# Patient Record
Sex: Female | Born: 1950
Health system: Southern US, Community
[De-identification: ages and names within clinical notes are randomized; demographics above are authoritative.]

## PROBLEM LIST (undated history)

## (undated) DIAGNOSIS — E785 Hyperlipidemia, unspecified: Secondary | ICD-10-CM

## (undated) HISTORY — PX: ABDOMINAL HYSTERECTOMY: SHX81

## (undated) HISTORY — PX: BLADDER SURGERY: SHX569

## (undated) HISTORY — DX: Hyperlipidemia, unspecified: E78.5

## (undated) HISTORY — PX: TUBAL LIGATION: SHX77

---

## 2008-09-21 ENCOUNTER — Emergency Department: Payer: Self-pay | Admitting: Unknown Physician Specialty

## 2009-08-13 ENCOUNTER — Ambulatory Visit: Payer: Self-pay | Admitting: Unknown Physician Specialty

## 2009-08-15 ENCOUNTER — Ambulatory Visit: Payer: Self-pay | Admitting: Unknown Physician Specialty

## 2010-07-25 ENCOUNTER — Ambulatory Visit: Payer: Self-pay | Admitting: Internal Medicine

## 2013-09-14 ENCOUNTER — Observation Stay: Payer: Self-pay | Admitting: Internal Medicine

## 2013-09-14 LAB — TROPONIN I
Troponin-I: <0.02 ng/mL
Troponin-I: 0.03 ng/mL

## 2013-09-14 LAB — HEPATIC FUNCTION PANEL A (ARMC)
ALBUMIN: 3.5 g/dL (ref 3.4–5.0)
AST: 21 U/L (ref 15–37)
Alkaline Phosphatase: 64 U/L
Bilirubin, Direct: <0.1 mg/dL (ref 0.00–0.20)
Bilirubin,Total: 0.4 mg/dL (ref 0.2–1.0)
SGPT (ALT): 17 U/L (ref 12–78)
TOTAL PROTEIN: 7.8 g/dL (ref 6.4–8.2)

## 2013-09-14 LAB — CBC
HCT: 39.3 % (ref 35.0–47.0)
HGB: 12.9 g/dL (ref 12.0–16.0)
MCH: 30.8 pg (ref 26.0–34.0)
MCHC: 32.8 g/dL (ref 32.0–36.0)
MCV: 94 fL (ref 80–100)
Platelet: 299 10*3/uL (ref 150–440)
RBC: 4.19 10*6/uL (ref 3.80–5.20)
RDW: 13.3 % (ref 11.5–14.5)
WBC: 6.2 10*3/uL (ref 3.6–11.0)

## 2013-09-14 LAB — BASIC METABOLIC PANEL
Anion Gap: 6 — ABNORMAL LOW (ref 7–16)
BUN: 11 mg/dL (ref 7–18)
CO2: 27 mmol/L (ref 21–32)
Calcium, Total: 8.3 mg/dL — ABNORMAL LOW (ref 8.5–10.1)
Chloride: 104 mmol/L (ref 98–107)
Creatinine: 0.85 mg/dL (ref 0.60–1.30)
EGFR (African American): >60
Glucose: 152 mg/dL — ABNORMAL HIGH (ref 65–99)
OSMOLALITY: 276 (ref 275–301)
POTASSIUM: 3.8 mmol/L (ref 3.5–5.1)
SODIUM: 137 mmol/L (ref 136–145)

## 2013-09-14 LAB — D-DIMER(ARMC): D-DIMER: 659 ng/mL

## 2013-09-14 LAB — PRO B NATRIURETIC PEPTIDE: B-TYPE NATIURETIC PEPTID: 120 pg/mL (ref 0–125)

## 2013-09-14 LAB — HEMOGLOBIN A1C: HEMOGLOBIN A1C: 5.9 % (ref 4.2–6.3)

## 2013-09-14 LAB — MAGNESIUM: Magnesium: 2 mg/dL

## 2013-09-15 DIAGNOSIS — I369 Nonrheumatic tricuspid valve disorder, unspecified: Secondary | ICD-10-CM

## 2013-09-15 DIAGNOSIS — R079 Chest pain, unspecified: Secondary | ICD-10-CM

## 2013-09-15 LAB — BASIC METABOLIC PANEL
Anion Gap: 7 (ref 7–16)
BUN: 9 mg/dL (ref 7–18)
CALCIUM: 8.6 mg/dL (ref 8.5–10.1)
CO2: 28 mmol/L (ref 21–32)
Chloride: 103 mmol/L (ref 98–107)
Creatinine: 0.89 mg/dL (ref 0.60–1.30)
EGFR (Non-African Amer.): >60
Glucose: 153 mg/dL — ABNORMAL HIGH (ref 65–99)
Osmolality: 277 (ref 275–301)
POTASSIUM: 3.6 mmol/L (ref 3.5–5.1)
Sodium: 138 mmol/L (ref 136–145)

## 2013-09-15 LAB — TSH: Thyroid Stimulating Horm: 1.49 u[IU]/mL

## 2013-09-15 LAB — CBC WITH DIFFERENTIAL/PLATELET
Basophil #: 0 10*3/uL (ref 0.0–0.1)
Basophil %: 0.5 %
Eosinophil #: 0.4 10*3/uL (ref 0.0–0.7)
Eosinophil %: 5.9 %
HCT: 39.6 % (ref 35.0–47.0)
HGB: 12.8 g/dL (ref 12.0–16.0)
Lymphocyte #: 2.5 10*3/uL (ref 1.0–3.6)
Lymphocyte %: 40.2 %
MCH: 30.5 pg (ref 26.0–34.0)
MCHC: 32.4 g/dL (ref 32.0–36.0)
MCV: 94 fL (ref 80–100)
Monocyte #: 0.5 x10 3/mm (ref 0.2–0.9)
Monocyte %: 8.4 %
Neutrophil #: 2.8 10*3/uL (ref 1.4–6.5)
Neutrophil %: 45 %
PLATELETS: 283 10*3/uL (ref 150–440)
RBC: 4.21 10*6/uL (ref 3.80–5.20)
RDW: 13.8 % (ref 11.5–14.5)
WBC: 6.3 10*3/uL (ref 3.6–11.0)

## 2013-09-15 LAB — TROPONIN I: TROPONIN-I: 0.05 ng/mL

## 2013-09-15 LAB — LIPID PANEL
CHOLESTEROL: 200 mg/dL (ref 0–200)
HDL Cholesterol: 37 mg/dL — ABNORMAL LOW (ref 40–60)
Ldl Cholesterol, Calc: 129 mg/dL — ABNORMAL HIGH (ref 0–100)
TRIGLYCERIDES: 168 mg/dL (ref 0–200)
VLDL CHOLESTEROL, CALC: 34 mg/dL (ref 5–40)

## 2014-06-24 NOTE — Discharge Summary (Signed)
PATIENT NAMMarvel Plan:  [name], [name] MR#:  [id number] DATE OF BIRTH:  04/03/1950  DATE OF ADMISSION:  09/14/2013 DATE OF DISCHARGE:  09/15/2013  PRIMARY CARE PHYSICIAN:  [name] CorporalFayegh H. Jadali, MD.  CHIEF COMPLAINT: Shortness of breath and weakness.  HISTORY OF PRESENT ILLNESS AND HOSPITAL COURSE:  Ms. 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 She did have some shortness of breath. She was admitted on telemetry and her 3 sets of cardiac enzymes were negative. EKG did not show any acute changes and Myoview stress test, along echo were essentially negative for ischemia. Ejection fraction was 70%. The patient's symptoms improved. She received some IV hydration. Overall, she felt better.  She will be discharged and establish primary care with Dr. Dario [name].   For hyperlipidemia. The patient was recommended to have low fat, low cholesterol diet and follow up with Dr. Dario [name] as outpatient and if her LDL remains persistently elevated, recommend start her on statins. Above was discussed with the patient's daughter. She did voice understanding the discharge planning and overall hospital stay remained stable. The patient remained a full code.   PROCEDURES: Myoview stress test essentially negative.  EF of 70%.   LABORATORY DATA:  CBC within normal limits. Basic metabolic panel within normal limits, except glucose of 153, hemoglobin A1c is 5.9. LDL is 129. Cholesterol is 200. TSH is 1.49. Cardiac enzymes x3 negative.   RADIOLOGY DATA:  CT angiography of the chest is negative for acute PE. Hospital stay otherwise remained stable.   CODE STATUS:  The patient remained a full code.   TIME SPENT: 40 minutes.   ____________________________ [name] HailSona A. Allena KatzPatel, MD sap:ds D: 09/15/2013 14:44:06 ET T: 09/15/2013 15:01:14  ET JOB#: [id number]  cc: [name] Remmers A. Allena KatzPatel, MD, <Dictator> [name] CorporalFayegh H. Jadali, MD Willow OraSONA A Arriyah Madej MD ELECTRONICALLY SIGNED 09/27/2013 15:37

## 2014-06-24 NOTE — H&P (Signed)
PATIENT NAMMarvel [name]:  [name], [name] DATE OF BIRTH:  28-Dec-1950  DATE OF ADMISSION:  09/14/2013  PRIMARY CARE PHYSICIAN: Actually was her GYN doctor, Dr. Barnabas ListerWashington.  REFERRING PHYSICIAN: Sheryl L. Mindi JunkerGottlieb, MD  HISTORY OF PRESENT ILLNESS:  This is a very nice 64 year old female with history of menopause and hyperlipidemia, who comes today with vague symptoms, feeling really tired today. This morning, got up and as the day went on, she started to have symptoms related to shortness of breath. Last night, she was not able to sleep because she felt heaviness in her chest, not able to take deep breaths, not able to lie flat on the bed. She had to sleep with a couple of pillows on her back. This morning, she was doing her normal routine, which is working in garden. She is very active. She does a lot of walking in and out of the house and getting down on her knees a lot.  On one of those times that she was up and down, she got really dizzy, felt very tired and at that point, she decided to come into the Emergency Department.   The patient states that she does not have chest pain or pressure at this moment.  She had some pressure last night, but it was more like difficulty breathing rather than the pain. She does not have any swelling. Denies any palpitations. Her EKG is normal sinus rhythm, but she has multiple PACs on the monitor.   The patient has no nausea, vomiting. A CT scan of the chest was done with contrast showing no signs of PE, but multiple cholesterol plaques into her aorta. The patient is admitted for evaluation of shortness of breath and orthopnea and exertional dyspnea today.   Apparently, the patient was getting short of breath, as she was walking around the house. She was able to do most of her work, but after the time progressed, she was getting more tired and more short of breath with just minimal activity.   REVIEW OF SYSTEMS:  Twelve system review of systems is done.   CONSTITUTIONAL: No fever, fatigue, positive generalized weakness.  EYES: No blurry vision, double vision.  ENT: No difficulty swallowing or tinnitus.  RESPIRATORY: As mentioned above.   CARDIOVASCULAR: Positive orthopnea. No edema. No syncope. No previous arrhythmias. No palpitations.  GASTROINTESTINAL: No nausea, vomiting, constipation, diarrhea.  GENITOURINARY: No dysuria, hematuria.  GYNECOLOGIC: No breast masses.  HEMATOLOGIC AND LYMPHATIC: No anemia, easy bruising or bleeding.  SKIN: No rash or petechiae.  MUSCULOSKELETAL: No neck pain or back pain.  NEUROLOGIC: No numbness, tingling, CVAs or TIAs.  PSYCHIATRIC: No insomnia or depression.   PAST MEDICAL HISTORY: 1.  Hyperlipidemia.  2.  Menopause.   ALLERGIES: Not known drug allergies.   PAST SURGICAL HISTORY:  1.  Total abdominal hysterectomy.  2.  Salpingo-oophorectomy.   CURRENT MEDICATIONS: The patient has an Estradiol patch and that is the only medication she takes.   SOCIAL HISTORY: The patient does not smoke, does not drink, does not use drugs. She lives with her husband, daughter and grandkids.   FAMILY HISTORY: Negative for CVA or MI, as far as she knows. Her family is [place] and does not know much of her history.  She was born in [place], but she has been in the [place] many years.   PHYSICAL EXAMINATION: VITAL SIGNS: Blood pressure 135/95, pulse 79, respirations 16, temperature 98.2, oxygen saturation 97% on room air.  GENERAL:  Alert, oriented x3  in no acute distress. No respiratory distress. Hemodynamically stable.  HEENT: Pupils are equal and reactive. Extraocular movements are intact. Mucosa are moist. Anicteric sclerae. Pink conjunctivae. No oral lesions. No oropharyngeal exudates.  NECK: Supple. No JVD. No thyromegaly. No adenopathy. No carotid bruits. No rigidity.   CARDIOVASCULAR: Mostly regular rate and rhythm with occasional PACs. No displacement of PMI. No tenderness to palpation of anterior  chest wall.  LUNGS: Clear without any wheezing or crepitus. No use of accessory muscles.  ABDOMEN: Soft, nontender, nondistended. No hepatosplenomegaly. No masses. Bowel sounds are positive.  GENITAL: Deferred.  EXTREMITIES: No edema, cyanosis or clubbing. Pulses +2. Capillary refill less than 3.  NEUROLOGIC: Cranial nerves II-XII  intact. Strength is 5/5 all 4  extremities.  PSYCHIATRIC: No agitation. The patient is alert, oriented x3.  SKIN: No rashes or petechiae.   LABORATORY DATA:  Glucose 152, BUN 11, creatinine 0.85, calcium is 8.3. LFTs were within normal limits. Troponin is negative. White blood count 6.2, hemoglobin 12.9, platelet count 299,000. D-dimer is 659.   EKG:  Normal sinus rhythm, but on the monitor you can see multiple PACs.   RADIOLOGY DATA:  Chest x-ray:  Negative for infiltrates, negative for CHF. BNP is normal.   ASSESSMENT AND [name]: A 64 year old female with orthopnea, progressive shortness of breath exertional dyspnea, history of hyperlipidemia and hormonal replacement therapy.  1.  Shortness of breath/dyspnea of exertion and orthopnea. At this moment, the patient had a CT scan of the chest that did not show any pulmonary embolus. The patient has atherosclerotic plaque, but not a known history of coronary artery disease. The patient used to be on a statins, but no longer taking.  2.  Her symptoms ar back and could be related to mild congestive heart failure. The patient has a normal BNP and no signs of fluid overload.  For now, we are just going to monitor, get an echocardiogram in the morning. Also considered possibility of this being related to acute arrhythmias, as the patient has been showing multiple PACs.  Keep the patient on telemetry overnight. Consider the possibility of acute coronary syndrome as an equivalent of angina with her progressive shortness of breath. The patient had atherosclerotic disease. We are going to start her on aspirin, heparin prophylactic.  Start her on a statin and check her lipid profile. Since the  patient has progressive weakness, check TSH.  Check magnesium, as the patient is having multiple PACs. Admit under observation.  At this moment, the patient is hemodynamically stable. Her blood pressure is stable.  No need for a beta blocker, as her heart rate is in the 60s-70s, but we are going to labetalol p.r.n. for the moment.  3.  Morphine if chest pain, but for now, we are going to avoid IV fluids and get an echocardiogram in case the patient has some degree of CHF.  4.  The patient is on hormonal replacement therapy. We are going to hold that for now and monitor closely.  5.  Deep vein thrombosis prophylaxis with heparin, gastrointestinal prophylaxis with Pepcid.   I spent about 50 minutes with this patient.    ____________________________ Felipa Furnace, MD rsg:ds D: 09/14/2013 21:30:59 ET T: 09/14/2013 22:59:03 ET JOB#: [id number]  cc: Felipa Furnace, MD, <Dictator> Chizuko Trine Juanda Chance MD ELECTRONICALLY SIGNED 09/15/2013 23:14

## 2014-08-28 ENCOUNTER — Encounter: Payer: Self-pay | Admitting: Unknown Physician Specialty

## 2014-08-28 ENCOUNTER — Ambulatory Visit (INDEPENDENT_AMBULATORY_CARE_PROVIDER_SITE_OTHER): Payer: Medicare Other | Admitting: Unknown Physician Specialty

## 2014-08-28 VITALS — BP 130/68 | HR 80 | Temp 98.1°F | Ht <= 58 in | Wt 113.2 lb

## 2014-08-28 DIAGNOSIS — E785 Hyperlipidemia, unspecified: Secondary | ICD-10-CM | POA: Insufficient documentation

## 2014-08-28 DIAGNOSIS — Z780 Asymptomatic menopausal state: Secondary | ICD-10-CM

## 2014-08-28 MED ORDER — ESTRADIOL 0.05 MG/24HR TD PTTW: 1.0000 | MEDICATED_PATCH | TRANSDERMAL | Status: DC

## 2014-08-28 NOTE — Progress Notes (Signed)
   BP 130/68 mmHg  Pulse 80  Temp(Src) 98.1 F (36.7 C)  Ht 4\' 10"  (1.473 m)  Wt 113 lb 3.2 oz (51.347 kg)  BMI 23.67 kg/m2  SpO2 98%  LMP  (LMP Unknown)   Subjective:    Patient ID: [name], female    DOB: 07/14/1950, 64 y.o.   MRN: [id number]  HPI: [name] is a 64 y.o. female  Chief Complaint  Patient presents with  . Medication Refill    hormone patch    Relevant past medical, surgical, family and social history reviewed and updated as indicated. Interim medical history since our last visit reviewed. Allergies and medications reviewed and updated.  States that her daughter wants her patch refilled.  It is unclear why she needs this due to language barrier  Review of Systems  Per HPI unless specifically indicated above     Objective:    BP 130/68 mmHg  Pulse 80  Temp(Src) 98.1 F (36.7 C)  Ht 4\' 10"  (1.473 m)  Wt 113 lb 3.2 oz (51.347 kg)  BMI 23.67 kg/m2  SpO2 98%  LMP  (LMP Unknown)  Wt Readings from Last 3 Encounters:  08/28/14 113 lb 3.2 oz (51.347 kg)  04/13/12 112 lb (50.803 kg)    Physical Exam  Assessment & Plan:   Problem List Items Addressed This Visit    None       Follow up plan: No Follow-up on file.   Unable to complete visit due to language barrier.  Unable to maintain connection with the telephone interpreter.  Will reschedule when we can schedule an interpreter ahead of time.

## 2014-08-30 ENCOUNTER — Encounter: Payer: Self-pay | Admitting: Unknown Physician Specialty

## 2014-08-30 ENCOUNTER — Ambulatory Visit (INDEPENDENT_AMBULATORY_CARE_PROVIDER_SITE_OTHER): Payer: Medicare HMO | Admitting: Unknown Physician Specialty

## 2014-08-30 VITALS — BP 132/81 | HR 80 | Temp 97.9°F | Ht <= 58 in | Wt 113.2 lb

## 2014-08-30 DIAGNOSIS — Z1231 Encounter for screening mammogram for malignant neoplasm of breast: Secondary | ICD-10-CM

## 2014-08-30 DIAGNOSIS — Z0000 Encounter for general adult medical examination without abnormal findings: Secondary | ICD-10-CM

## 2014-08-30 DIAGNOSIS — Z79890 Hormone replacement therapy (postmenopausal): Secondary | ICD-10-CM

## 2014-08-30 DIAGNOSIS — Z780 Asymptomatic menopausal state: Secondary | ICD-10-CM

## 2014-08-30 MED ORDER — ESTRADIOL 0.05 MG/24HR TD PTTW: 1.0000 | MEDICATED_PATCH | TRANSDERMAL | Status: DC

## 2014-08-30 NOTE — Progress Notes (Signed)
   BP 132/81 mmHg  Pulse 80  Temp(Src) 97.9 F (36.6 C)  Ht 4\' 10"  (1.473 m)  Wt 113 lb 3.2 oz (51.347 kg)  BMI 23.67 kg/m2  SpO2 100%  LMP  (LMP Unknown)   Subjective:    Patient ID: [name], female    DOB: 03/27/1950, 64 y.o.   MRN: [id number]  HPI: [name] is a 64 y.o. female  Chief Complaint  Patient presents with  . Medication Refill    Relevant past medical, surgical, family and social history reviewed and updated as indicated. Interim medical history since our last visit reviewed. Allergies and medications reviewed and updated.  She would like a refill of her hormone patch.  If she hasn't been on it for 2 weeks but if off it for 2 months she begins to get tired and fatigued.  If she puts the patch back on she is better.  She was advised by Dr. Barnabas ListerWashington to take this all the time to help her with "stamina" and "strength."  Without the hormone she is agitated and angry.    HPI  Review of Systems  Constitutional: Negative.   HENT: Negative.   Eyes: Negative.   Respiratory: Negative.   Cardiovascular: Negative.   Gastrointestinal: Negative.   Endocrine: Negative.   Genitourinary: Negative.   Musculoskeletal: Negative.   Skin: Negative.   Allergic/Immunologic: Negative.   Neurological: Negative.   Hematological: Negative.   Psychiatric/Behavioral: Negative.     Per HPI unless specifically indicated above     Objective:    BP 132/81 mmHg  Pulse 80  Temp(Src) 97.9 F (36.6 C)  Ht 4\' 10"  (1.473 m)  Wt 113 lb 3.2 oz (51.347 kg)  BMI 23.67 kg/m2  SpO2 100%  LMP  (LMP Unknown)  Wt Readings from Last 3 Encounters:  08/30/14 113 lb 3.2 oz (51.347 kg)  08/28/14 113 lb 3.2 oz (51.347 kg)  04/13/12 112 lb (50.803 kg)    Physical Exam  Constitutional: She is oriented to person, place, and time. She appears well-developed and well-nourished. No distress.  HENT:  Head: Normocephalic and atraumatic.  Eyes: Conjunctivae and lids are normal.  Right eye exhibits no discharge. Left eye exhibits no discharge. No scleral icterus.  Cardiovascular: Normal rate and regular rhythm.   Pulmonary/Chest: Effort normal. No respiratory distress.  Abdominal: Normal appearance. There is no splenomegaly or hepatomegaly.  Musculoskeletal: Normal range of motion.  Neurological: She is alert and oriented to person, place, and time.  Skin: Skin is intact. No rash noted. No pallor.  Psychiatric: She has a normal mood and affect. Her behavior is normal. Judgment and thought content normal.   Assessment & Plan:   Problem List Items Addressed This Visit      Other   Menopause - Primary    Other Visit Diagnoses    Hormone replacement therapy        Routine general medical examination at a health care facility            Follow up plan: No Follow-up on file.   Unable to order mammogram.

## 2014-08-30 NOTE — Patient Instructions (Signed)

## 2014-12-28 ENCOUNTER — Other Ambulatory Visit: Payer: Self-pay | Admitting: Unknown Physician Specialty

## 2014-12-29 NOTE — Telephone Encounter (Signed)
I can't find that I have ever seen her You saw her in June and she's on your schedule in December

## 2014-12-29 NOTE — Telephone Encounter (Signed)
Your patient.  Thanks 

## 2014-12-29 NOTE — Telephone Encounter (Signed)
I'm sorry, Elnita MaxwellCheryl, but again, I have never seen her and you just saw her for this in June; I really think she is your patient

## 2015-02-08 ENCOUNTER — Encounter: Payer: Self-pay | Admitting: Unknown Physician Specialty

## 2015-02-14 ENCOUNTER — Other Ambulatory Visit: Payer: Self-pay | Admitting: Unknown Physician Specialty

## 2015-02-14 NOTE — Telephone Encounter (Signed)
Do you mean hormone pateches?  I have not given pain patches

## 2015-02-14 NOTE — Telephone Encounter (Signed)
Called and left patient a voicemail asking for a return call to get clarification on the medication being requested.

## 2015-02-14 NOTE — Addendum Note (Signed)
Addended by: Bayard HuggerPHARR, BRITTANY N on: 02/14/2015 11:47 AM   Modules accepted: Orders

## 2015-02-14 NOTE — Telephone Encounter (Signed)
pts daughter would like to have pain patches sent to [facility] [address]

## 2015-02-14 NOTE — Telephone Encounter (Signed)
Routing to provider  

## 2015-02-15 NOTE — Telephone Encounter (Signed)
Called and left patient's daughter a voicemail asking for a return call to get clarification on the medication being requested.

## 2015-02-16 NOTE — Telephone Encounter (Signed)
Patient's daughter returned my call when I was out of the office yesterday afternoon. So I tried to call her back but she did not answer so I left her a voicemail asking for her to please return my call.

## 2015-02-19 ENCOUNTER — Encounter: Payer: Medicare Other | Admitting: Unknown Physician Specialty

## 2015-02-19 MED ORDER — ESTRADIOL 0.05 MG/24HR TD PTTW: 1.0000 | MEDICATED_PATCH | TRANSDERMAL | Status: DC

## 2015-02-19 NOTE — Telephone Encounter (Signed)
Called and spoke to patient's daughter and she stated they need the estradiol (vivelle-dot) refilled

## 2015-02-19 NOTE — Addendum Note (Signed)
Addended byVonita Moss: Deyon Chizek on: 02/19/2015 06:04 PM   Modules accepted: Orders

## 2015-02-19 NOTE — Telephone Encounter (Signed)
Called and spoke to patient's daughter and she stated they need the estradiol (vivelle-dot) patches refilled and sent to [facility] on [address].

## 2015-03-12 ENCOUNTER — Other Ambulatory Visit: Payer: Self-pay | Admitting: Unknown Physician Specialty

## 2015-04-20 ENCOUNTER — Ambulatory Visit (INDEPENDENT_AMBULATORY_CARE_PROVIDER_SITE_OTHER): Payer: Medicare HMO | Admitting: Unknown Physician Specialty

## 2015-04-20 ENCOUNTER — Encounter: Payer: Self-pay | Admitting: Unknown Physician Specialty

## 2015-04-20 VITALS — BP 133/85 | HR 73 | Temp 98.6°F | Ht <= 58 in | Wt 116.8 lb

## 2015-04-20 DIAGNOSIS — Z0000 Encounter for general adult medical examination without abnormal findings: Secondary | ICD-10-CM | POA: Diagnosis not present

## 2015-04-20 DIAGNOSIS — E785 Hyperlipidemia, unspecified: Secondary | ICD-10-CM | POA: Diagnosis not present

## 2015-04-20 DIAGNOSIS — Z780 Asymptomatic menopausal state: Secondary | ICD-10-CM | POA: Diagnosis not present

## 2015-04-20 DIAGNOSIS — Z23 Encounter for immunization: Secondary | ICD-10-CM

## 2015-04-20 DIAGNOSIS — Z5181 Encounter for therapeutic drug level monitoring: Secondary | ICD-10-CM

## 2015-04-20 MED ORDER — ATORVASTATIN CALCIUM 10 MG PO TABS: 10.0000 mg | ORAL_TABLET | Freq: Every day | ORAL | Status: AC

## 2015-04-20 MED ORDER — ESTRADIOL 0.05 MG/24HR TD PTWK: 0.0500 mg | MEDICATED_PATCH | TRANSDERMAL | Status: AC

## 2015-04-20 NOTE — Patient Instructions (Addendum)
Please do call to schedule your mammogram and bone density; the number to schedule one at either [facility] or [facility] is [phone]    Bone Densitometry Bone densitometry is an imaging test that uses a special X-ray to measure the amount of calcium and other minerals in your bones (bone density). This test is also known as a bone mineral density test or dual-energy X-ray absorptiometry (DXA). The test can measure bone density at your hip and your spine. It is similar to having a regular X-ray. You may have this test to:  Diagnose a condition that causes weak or thin bones (osteoporosis).  Predict your risk of a broken bone (fracture).  Determine how well osteoporosis treatment is working. LET [facility] CARE PROVIDER KNOW ABOUT:  Any allergies you have.  All medicines you are taking, including vitamins, herbs, eye drops, creams, and over-the-counter medicines.  Previous problems you or members of your family have had with the use of anesthetics.  Any blood disorders you have.  Previous surgeries you have had.  Medical conditions you have.  Possibility of pregnancy.  Any other medical test you had within the previous 14 days that used contrast material. RISKS AND COMPLICATIONS Generally, this is a safe procedure. However, problems can occur and may include the following:  This test exposes you to a very small amount of radiation.  The risks of radiation exposure may be greater to unborn children. BEFORE THE PROCEDURE  Do not take any calcium supplements for 24 hours before having the test. You can otherwise eat and drink what you usually do.  Take off all metal jewelry, eyeglasses, dental appliances, and any other metal objects. PROCEDURE  You may lie on an exam table. There will be an X-ray generator below you and an imaging device above you.  Other devices, such as boxes or braces, may be used to position your body properly for the  scan.  You will need to lie still while the machine slowly scans your body.  The images will show up on a computer monitor. AFTER THE PROCEDURE You may need more testing at a later time.   This information is not intended to replace advice given to you by your health care provider. Make sure you discuss any questions you have with your health care provider.   Document Released: 03/11/2004 Document Revised: 03/10/2014 Document Reviewed: 07/28/2013 Elsevier Interactive Patient Education [employer]

## 2015-04-20 NOTE — Progress Notes (Signed)
BP 133/85 mmHg  Pulse 73  Temp(Src) 98.6 F (37 C)  Ht 4' 9.8" (1.468 m)  Wt 116 lb 12.8 oz (52.98 kg)  BMI 24.58 kg/m2  SpO2 97%  LMP 04/04/2013 (Approximate)   Subjective:    Patient ID: [name], female    DOB: [birth year], 65 y.o.   MRN: [id number]  HPI: [name] is a 65 y.o. female  Chief Complaint  Patient presents with  . Medicare Wellness   Pt states she is healthy.  She just wants to make sure she doesn't have a chronic illness.    Hyperlipidemia Using medications without problems: No Muscle aches  Diet compliance: good Exercise: good - has a treadmill and walks around the house.    Menopause She has tried to wean off patch but found that she has hot flashes and mood changes if she comes off of it.    Functional Status Survey: Is the patient deaf or have difficulty hearing?: No Does the patient have difficulty seeing, even when wearing glasses/contacts?: No (pt states vision gets blurry sometimes, has reading glasses) Does the patient have difficulty concentrating, remembering, or making decisions?: No Does the patient have difficulty walking or climbing stairs?: No Does the patient have difficulty dressing or bathing?: No Does the patient have difficulty doing errands alone such as visiting a doctor's office or shopping?: No  Depression screen PHQ 2/9 04/20/2015  Decreased Interest 0  Down, Depressed, Hopeless 0  PHQ - 2 Score 0    Fall Risk  04/20/2015  Falls in the past year? No     Relevant past medical, surgical, family and social history reviewed and updated as indicated. Interim medical history since our last visit reviewed. Allergies and medications reviewed and updated.  Review of Systems  Constitutional: Negative.   HENT: Negative.   Eyes: Negative.   Respiratory: Negative.   Cardiovascular: Negative.   Gastrointestinal: Negative.   Endocrine: Negative.   Genitourinary: Negative.   Musculoskeletal: Negative.   Skin:  Negative.   Allergic/Immunologic: Negative.   Neurological: Negative.   Hematological: Negative.   Psychiatric/Behavioral: Negative.     Per HPI unless specifically indicated above     Objective:    BP 133/85 mmHg  Pulse 73  Temp(Src) 98.6 F (37 C)  Ht 4' 9.8" (1.468 m)  Wt 116 lb 12.8 oz (52.98 kg)  BMI 24.58 kg/m2  SpO2 97%  LMP 04/04/2013 (Approximate)  Wt Readings from Last 3 Encounters:  04/20/15 116 lb 12.8 oz (52.98 kg)  08/30/14 113 lb 3.2 oz (51.347 kg)  08/28/14 113 lb 3.2 oz (51.347 kg)    Physical Exam  Constitutional: She is oriented to person, place, and time. She appears well-developed and well-nourished.  HENT:  Head: Normocephalic and atraumatic.  Eyes: Pupils are equal, round, and reactive to light. Right eye exhibits no discharge. Left eye exhibits no discharge. No scleral icterus.  Neck: Normal range of motion. Neck supple. Carotid bruit is not present. No thyromegaly present.  Cardiovascular: Normal rate, regular rhythm and normal heart sounds.  Exam reveals no gallop and no friction rub.   No murmur heard. Pulmonary/Chest: Effort normal and breath sounds normal. No respiratory distress. She has no wheezes. She has no rales.  Abdominal: Soft. Bowel sounds are normal. There is no tenderness. There is no rebound.  Genitourinary: No breast swelling, tenderness or discharge.  Musculoskeletal: Normal range of motion.  Lymphadenopathy:    She has no cervical adenopathy.  Neurological: She is alert and  oriented to person, place, and time.  Skin: Skin is warm, dry and intact. No rash noted.  Psychiatric: She has a normal mood and affect. Her speech is normal and behavior is normal. Judgment and thought content normal. Cognition and memory are normal.       Assessment & [name]:   Problem List Items Addressed This Visit      Unprioritized   Hyperlipidemia   Relevant Medications   atorvastatin (LIPITOR) 10 MG tablet   Other Relevant Orders   Lipid Panel  w/o Chol/HDL Ratio   Menopause    Other Visit Diagnoses    Immunization due    -  Primary    Relevant Orders    Flu Vaccine QUAD 36+ mos IM (Completed)    Routine general medical examination at a health care facility        Relevant Orders    Hepatitis C antibody    DG Bone Density    Medication monitoring encounter        Relevant Orders    Comprehensive metabolic panel        Follow up [name]: Return in about 1 year (around 04/19/2016).

## 2015-04-21 LAB — COMPREHENSIVE METABOLIC PANEL
A/G RATIO: 1.2 (ref 1.1–2.5)
ALBUMIN: 4.1 g/dL (ref 3.6–4.8)
ALT: 13 IU/L (ref 0–32)
AST: 19 IU/L (ref 0–40)
Alkaline Phosphatase: 73 IU/L (ref 39–117)
BILIRUBIN TOTAL: 0.3 mg/dL (ref 0.0–1.2)
BUN / CREAT RATIO: 19 (ref 11–26)
BUN: 13 mg/dL (ref 8–27)
CHLORIDE: 100 mmol/L (ref 96–106)
CO2: 25 mmol/L (ref 18–29)
Calcium: 9.3 mg/dL (ref 8.7–10.3)
Creatinine, Ser: 0.68 mg/dL (ref 0.57–1.00)
GFR calc non Af Amer: 91 mL/min/{1.73_m2} (ref >59)
GFR, EST AFRICAN AMERICAN: 105 mL/min/{1.73_m2} (ref >59)
Globulin, Total: 3.4 g/dL (ref 1.5–4.5)
Glucose: 107 mg/dL — ABNORMAL HIGH (ref 65–99)
POTASSIUM: 4.3 mmol/L (ref 3.5–5.2)
Sodium: 143 mmol/L (ref 134–144)
TOTAL PROTEIN: 7.5 g/dL (ref 6.0–8.5)

## 2015-04-21 LAB — LIPID PANEL W/O CHOL/HDL RATIO
Cholesterol, Total: 257 mg/dL — ABNORMAL HIGH (ref 100–199)
HDL: 41 mg/dL (ref >39)
LDL Calculated: 169 mg/dL — ABNORMAL HIGH (ref 0–99)
Triglycerides: 233 mg/dL — ABNORMAL HIGH (ref 0–149)
VLDL Cholesterol Cal: 47 mg/dL — ABNORMAL HIGH (ref 5–40)

## 2015-04-21 LAB — HEPATITIS C ANTIBODY: Hep C Virus Ab: <0.1 s/co ratio (ref 0.0–0.9)

## 2015-04-24 ENCOUNTER — Encounter: Payer: Self-pay | Admitting: Unknown Physician Specialty

## 2015-08-12 ENCOUNTER — Encounter: Payer: Self-pay | Admitting: Emergency Medicine

## 2015-08-12 ENCOUNTER — Emergency Department
Admission: EM | Admit: 2015-08-12 | Discharge: 2015-08-12 | Disposition: A | Discharge status: Home / Self Care | Payer: Medicare HMO | Attending: Emergency Medicine | Admitting: Emergency Medicine

## 2015-08-12 DIAGNOSIS — Z79899 Other long term (current) drug therapy: Secondary | ICD-10-CM | POA: Insufficient documentation

## 2015-08-12 DIAGNOSIS — R531 Weakness: Secondary | ICD-10-CM

## 2015-08-12 DIAGNOSIS — E785 Hyperlipidemia, unspecified: Secondary | ICD-10-CM | POA: Diagnosis not present

## 2015-08-12 LAB — TROPONIN I: Troponin I: <0.03 ng/mL (ref <0.031)

## 2015-08-12 LAB — COMPREHENSIVE METABOLIC PANEL
ALT: 14 U/L (ref 14–54)
ANION GAP: 7 (ref 5–15)
AST: 25 U/L (ref 15–41)
Albumin: 3.9 g/dL (ref 3.5–5.0)
Alkaline Phosphatase: 77 U/L (ref 38–126)
BUN: 12 mg/dL (ref 6–20)
CHLORIDE: 105 mmol/L (ref 101–111)
CO2: 26 mmol/L (ref 22–32)
Calcium: 9 mg/dL (ref 8.9–10.3)
Creatinine, Ser: 0.87 mg/dL (ref 0.44–1.00)
GFR calc non Af Amer: >60 mL/min (ref >60)
Glucose, Bld: 124 mg/dL — ABNORMAL HIGH (ref 65–99)
Potassium: 3.9 mmol/L (ref 3.5–5.1)
SODIUM: 138 mmol/L (ref 135–145)
Total Bilirubin: 0.5 mg/dL (ref 0.3–1.2)
Total Protein: 7.1 g/dL (ref 6.5–8.1)

## 2015-08-12 LAB — URINALYSIS COMPLETE WITH MICROSCOPIC (ARMC ONLY)
BACTERIA UA: NONE SEEN
Bilirubin Urine: NEGATIVE
Glucose, UA: NEGATIVE mg/dL
HGB URINE DIPSTICK: NEGATIVE
Ketones, ur: NEGATIVE mg/dL
LEUKOCYTES UA: NEGATIVE
Nitrite: NEGATIVE
PROTEIN: 30 mg/dL — AB
RBC / HPF: NONE SEEN RBC/hpf (ref 0–5)
SPECIFIC GRAVITY, URINE: 1.019 (ref 1.005–1.030)
pH: 5 (ref 5.0–8.0)

## 2015-08-12 LAB — CBC
HEMATOCRIT: 39.4 % (ref 35.0–47.0)
HEMOGLOBIN: 13 g/dL (ref 12.0–16.0)
MCH: 30.4 pg (ref 26.0–34.0)
MCHC: 33 g/dL (ref 32.0–36.0)
MCV: 92.2 fL (ref 80.0–100.0)
Platelets: 241 10*3/uL (ref 150–440)
RBC: 4.27 MIL/uL (ref 3.80–5.20)
RDW: 13.4 % (ref 11.5–14.5)
WBC: 13.1 10*3/uL — ABNORMAL HIGH (ref 3.6–11.0)

## 2015-08-12 LAB — LIPASE, BLOOD: Lipase: 35 U/L (ref 11–51)

## 2015-08-12 MED ORDER — SODIUM CHLORIDE 0.9 % IV BOLUS (SEPSIS)
1000.0000 mL | Freq: Once | INTRAVENOUS | Status: AC
  Administered 2015-08-12: 1000 mL via INTRAVENOUS

## 2015-08-12 NOTE — ED Provider Notes (Signed)
[facility] Emergency Department Provider Note   ____________________________________________  Time seen: ~0500  I have reviewed the triage vital signs and the nursing notes.   HISTORY  Chief Complaint Emesis   History limited by: Language. History and translation provided by daughter.    HPI [name] is a 65 y.o. female who presents to the emergency department today because of concerns for weakness. This has been going on for the past 2-3 days. It has been somewhat waxing and waning. The daughter states that the patient has not been sleeping a lot recently secondary to family member who is recently undergone CABG. Because of this the patient has been up a lot. No fevers. No abdominal pain. No chest pain or shortness breath. Patient has had similar symptoms once in the past was told she was dehydrated.     Past Medical History  Diagnosis Date  . Hyperlipidemia     Patient Active Problem List   Diagnosis Date Noted  . Menopause 08/30/2014  . Hyperlipidemia 08/28/2014    Past Surgical History  Procedure Laterality Date  . Abdominal hysterectomy    . Tubal ligation    . Bladder surgery      Current Outpatient Rx  Name  Route  Sig  Dispense  Refill  . atorvastatin (LIPITOR) 10 MG tablet   Oral   Take 1 tablet (10 mg total) by mouth daily. Reported on 04/20/2015   90 tablet   3   . estradiol (CLIMARA - DOSED IN MG/24 HR) 0.05 mg/24hr patch   Transdermal   Place 1 patch (0.05 mg total) onto the skin once a week.   12 patch   1     Allergies Review of patient's allergies indicates no known allergies.  Family History  Problem Relation Age of Onset  . Liver disease Sister   . Liver disease Brother     Social History Social History  Substance Use Topics  . Smoking status: Never Smoker   . Smokeless tobacco: Never Used  . Alcohol Use: 0.0 oz/week    0 Standard drinks or equivalent per week     Comment: on occasion     Review of Systems  Constitutional: Negative for fever. Cardiovascular: Negative for chest pain. Respiratory: Negative for shortness of breath. Gastrointestinal: Negative for abdominal pain, vomiting and diarrhea. Genitourinary: Negative for dysuria. Musculoskeletal: Negative for back pain. Skin: Negative for rash. Neurological: Negative for headaches, focal weakness or numbness.  10-point ROS otherwise negative.  ____________________________________________   PHYSICAL EXAM:  VITAL SIGNS: ED Triage Vitals  Enc Vitals Group     BP 08/12/15 0300 105/70 mmHg     Pulse Rate 08/12/15 0300 66     Resp 08/12/15 0300 14     Temp 08/12/15 0300 97.6 F (36.4 C)     Temp Source 08/12/15 0300 Oral     SpO2 08/12/15 0300 95 %     Weight 08/12/15 0300 110 lb (49.896 kg)     Height 08/12/15 0300  (1.549 m)     Head Cir --      Peak Flow --      Pain Score 08/12/15 0301 0     Pain Loc --      Pain Edu? --      Excl. in GC? --     Constitutional: Alert and oriented. Well appearing and in no distress. Eyes: Conjunctivae are normal. PERRL. Normal extraocular movements. ENT   Head: Normocephalic and atraumatic.  Nose: No congestion/rhinnorhea.   Mouth/Throat: Mucous membranes are moist.   Neck: No stridor. Hematological/Lymphatic/Immunilogical: No cervical lymphadenopathy. Cardiovascular: Normal rate, regular rhythm.  No murmurs, rubs, or gallops. Respiratory: Normal respiratory effort without tachypnea nor retractions. Breath sounds are clear and equal bilaterally. No wheezes/rales/rhonchi. Gastrointestinal: Soft and nontender. No distention. There is no CVA tenderness. Genitourinary: Deferred Musculoskeletal: Normal range of motion in all extremities. No joint effusions.  No lower extremity tenderness nor edema. Neurologic:  Normal speech and language. No gross focal neurologic deficits are appreciated.  Skin:  Skin is warm, dry and intact. No rash  noted. Psychiatric: Mood and affect are normal. Speech and behavior are normal. Patient exhibits appropriate insight and judgment.  ____________________________________________    LABS (pertinent positives/negatives)  Labs Reviewed  CBC - Abnormal; Notable for the following:    WBC 13.1 (*)    All other components within normal limits  URINALYSIS COMPLETEWITH MICROSCOPIC ([facility] ONLY) - Abnormal; Notable for the following:    Color, Urine YELLOW (*)    APPearance CLEAR (*)    Protein, ur 30 (*)    Squamous Epithelial / LPF 0-5 (*)    All other components within normal limits  COMPREHENSIVE METABOLIC PANEL - Abnormal; Notable for the following:    Glucose, Bld 124 (*)    All other components within normal limits  TROPONIN I  LIPASE, BLOOD     ____________________________________________   EKG  I, Phineas SemenGraydon Raif Chachere, attending physician, personally viewed and interpreted this EKG  EKG Time: 0258 Rate: 63 Rhythm: normal sinus rhythm Axis: normal Intervals: qtc 435 QRS: narrow, q waves V1 ST changes: no st elevation Impression: abnormal ekg   ____________________________________________    RADIOLOGY  None  ____________________________________________   PROCEDURES  Procedure(s) performed: None  Critical Care performed: No  ____________________________________________   INITIAL IMPRESSION / ASSESSMENT AND PLAN / ED COURSE  Pertinent labs & imaging results that were available during my care of the patient were reviewed by me and considered in my medical decision making (see chart for details).  Patient presents to the emergency department today because of concerns for weakness for the past 2-3 days. Work without any concerning findings. Awaiting urine. Will give fluid bolus.  ----------------------------------------- 6:06 AM on 08/12/2015 -----------------------------------------  Patient states she is feeling better after some fluids. This point I think  likely dehydration stress and lack of sleep or contributing to the patient's symptoms. Will discharge home. Encouraged primary care follow up. ____________________________________________   FINAL CLINICAL IMPRESSION(S) / ED DIAGNOSES  Final diagnoses:  Weakness     Note: This dictation was prepared with Dragon dictation. Any transcriptional errors that result from this process are unintentional    Phineas SemenGraydon Jawann Urbani, MD 08/12/15 [phone]

## 2015-08-12 NOTE — ED Notes (Signed)
Pt reports feeling dizzy and weak x3 days. Pt reports that this has happened before and that she feels about the same this time. Pt is a/o and from [place]. Daughter is at bedside to translate.

## 2015-08-12 NOTE — Discharge Instructions (Signed)
Please seek medical attention for any high fevers, chest pain, shortness of breath, change in behavior, persistent vomiting, bloody stool or any other new or concerning symptoms. ° °Weakness °Weakness is a lack of strength. You may feel weak all over your body or just in one part of your body. Weakness can be serious. In some cases, you may need more medical tests. °HOME CARE °· Rest. °· Eat a well-balanced diet. °· Try to exercise every day. °· Only take medicines as told by your doctor. °GET HELP RIGHT AWAY IF:  °· You cannot do your normal daily activities. °· You cannot walk up and down stairs, or you feel very tired when you do so. °· You have shortness of breath or chest pain. °· You have trouble moving parts of your body. °· You have weakness in only one body part or on only one side of the body. °· You have a fever. °· You have trouble speaking or swallowing. °· You cannot control when you pee (urinate) or poop (bowel movement). °· You have black or bloody throw up (vomit) or poop. °· Your weakness gets worse or spreads to other body parts. °· You have new aches or pains. °MAKE SURE YOU:  °· Understand these instructions. °· Will watch your condition. °· Will get help right away if you are not doing well or get worse. °  °This information is not intended to replace advice given to you by your health care provider. Make sure you discuss any questions you have with your health care provider. °  °Document Released: 01/31/2008 Document Revised: 08/19/2011 Document Reviewed: 04/18/2011 °Elsevier Interactive Patient Education ©2016 [employer] ° ° °

## 2016-03-10 DIAGNOSIS — Z0000 Encounter for general adult medical examination without abnormal findings: Secondary | ICD-10-CM | POA: Diagnosis not present

## 2016-03-10 DIAGNOSIS — Z79890 Hormone replacement therapy (postmenopausal): Secondary | ICD-10-CM | POA: Diagnosis not present

## 2016-03-11 DIAGNOSIS — Z0000 Encounter for general adult medical examination without abnormal findings: Secondary | ICD-10-CM | POA: Diagnosis not present

## 2016-05-01 DIAGNOSIS — Z01419 Encounter for gynecological examination (general) (routine) without abnormal findings: Secondary | ICD-10-CM | POA: Diagnosis not present

## 2016-05-01 DIAGNOSIS — Z1211 Encounter for screening for malignant neoplasm of colon: Secondary | ICD-10-CM | POA: Diagnosis not present

## 2016-05-01 DIAGNOSIS — Z1272 Encounter for screening for malignant neoplasm of vagina: Secondary | ICD-10-CM | POA: Diagnosis not present

## 2016-07-18 DIAGNOSIS — Z1211 Encounter for screening for malignant neoplasm of colon: Secondary | ICD-10-CM | POA: Diagnosis not present

## 2016-07-18 DIAGNOSIS — R7303 Prediabetes: Secondary | ICD-10-CM | POA: Diagnosis not present

## 2016-07-18 DIAGNOSIS — E782 Mixed hyperlipidemia: Secondary | ICD-10-CM | POA: Diagnosis not present

## 2017-03-30 DIAGNOSIS — R300 Dysuria: Secondary | ICD-10-CM | POA: Diagnosis not present

## 2017-12-07 DIAGNOSIS — E782 Mixed hyperlipidemia: Secondary | ICD-10-CM | POA: Diagnosis not present

## 2017-12-07 DIAGNOSIS — Z0000 Encounter for general adult medical examination without abnormal findings: Secondary | ICD-10-CM | POA: Diagnosis not present

## 2017-12-07 DIAGNOSIS — R7303 Prediabetes: Secondary | ICD-10-CM | POA: Diagnosis not present

## 2017-12-15 DIAGNOSIS — R7303 Prediabetes: Secondary | ICD-10-CM | POA: Diagnosis not present

## 2017-12-15 DIAGNOSIS — Z0000 Encounter for general adult medical examination without abnormal findings: Secondary | ICD-10-CM | POA: Diagnosis not present

## 2017-12-15 DIAGNOSIS — E782 Mixed hyperlipidemia: Secondary | ICD-10-CM | POA: Diagnosis not present

## 2017-12-15 DIAGNOSIS — I10 Essential (primary) hypertension: Secondary | ICD-10-CM | POA: Diagnosis not present

## 2017-12-16 ENCOUNTER — Other Ambulatory Visit: Payer: Self-pay | Admitting: Family Medicine

## 2017-12-16 DIAGNOSIS — Z1231 Encounter for screening mammogram for malignant neoplasm of breast: Secondary | ICD-10-CM

## 2018-01-06 ENCOUNTER — Ambulatory Visit
Admission: RE | Admit: 2018-01-06 | Discharge: 2018-01-06 | Disposition: A | Discharge status: Home / Self Care | Payer: Medicare HMO | Source: Ambulatory Visit | Attending: Family Medicine | Admitting: Family Medicine

## 2018-01-06 ENCOUNTER — Encounter (HOSPITAL_COMMUNITY): Payer: Self-pay

## 2018-01-06 DIAGNOSIS — Z1231 Encounter for screening mammogram for malignant neoplasm of breast: Secondary | ICD-10-CM | POA: Diagnosis not present

## 2019-09-15 ENCOUNTER — Other Ambulatory Visit: Payer: Self-pay | Admitting: Family Medicine

## 2019-09-15 DIAGNOSIS — Z1231 Encounter for screening mammogram for malignant neoplasm of breast: Secondary | ICD-10-CM

## 2019-10-10 ENCOUNTER — Other Ambulatory Visit: Payer: Self-pay

## 2019-10-10 ENCOUNTER — Ambulatory Visit
Admission: RE | Admit: 2019-10-10 | Discharge: 2019-10-10 | Disposition: A | Discharge status: Home / Self Care | Payer: Medicare Other | Source: Ambulatory Visit | Attending: Family Medicine | Admitting: Family Medicine

## 2019-10-10 DIAGNOSIS — Z1231 Encounter for screening mammogram for malignant neoplasm of breast: Secondary | ICD-10-CM | POA: Diagnosis not present

## 2021-02-22 IMAGING — MG DIGITAL SCREENING BILAT W/ TOMO W/ CAD
6 of 10 series · 6 of 30 positions shown · non-contrast
Comparison: Previous exam(s).

CLINICAL DATA: Screening.

EXAM:
DIGITAL SCREENING BILATERAL MAMMOGRAM WITH TOMO AND CAD

[L MLO synth-2D (1 of 2)]
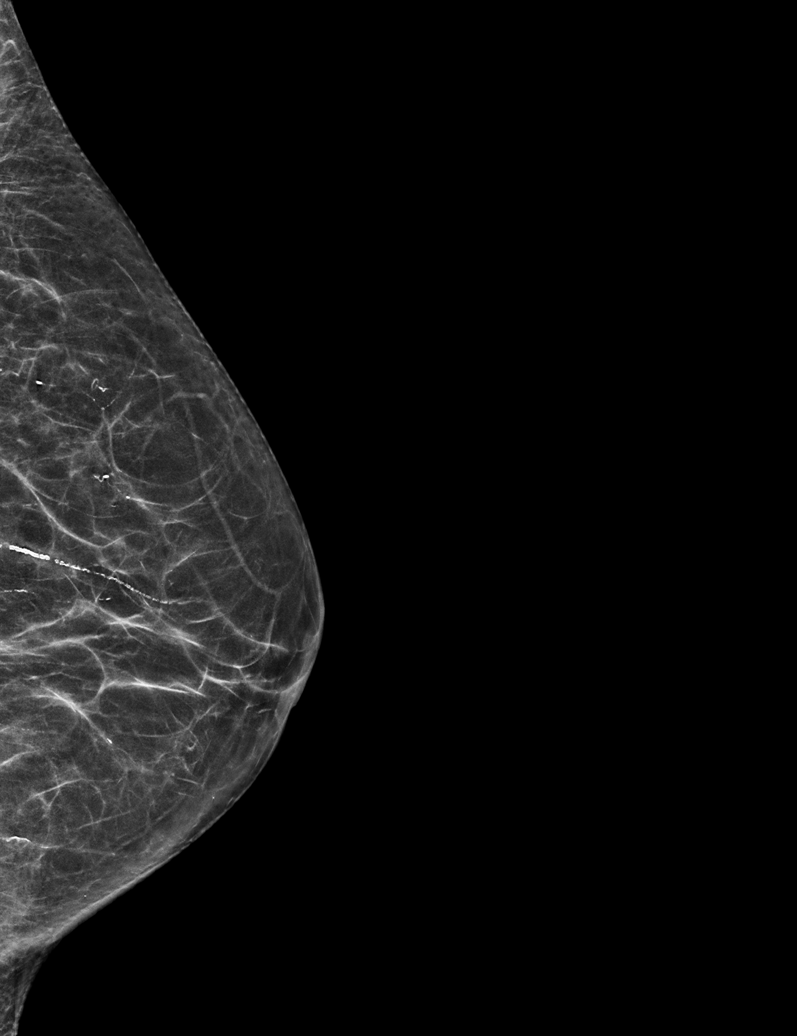

[L MLO synth-2D (2 of 2)]
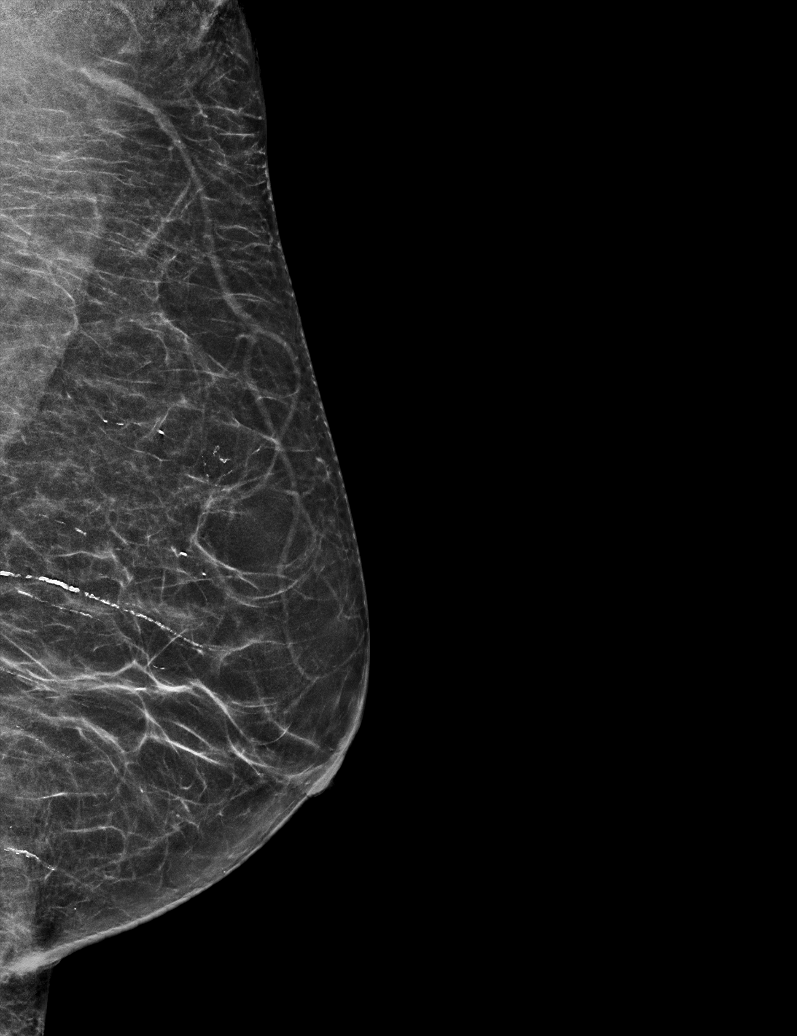

[R CC synth-2D]
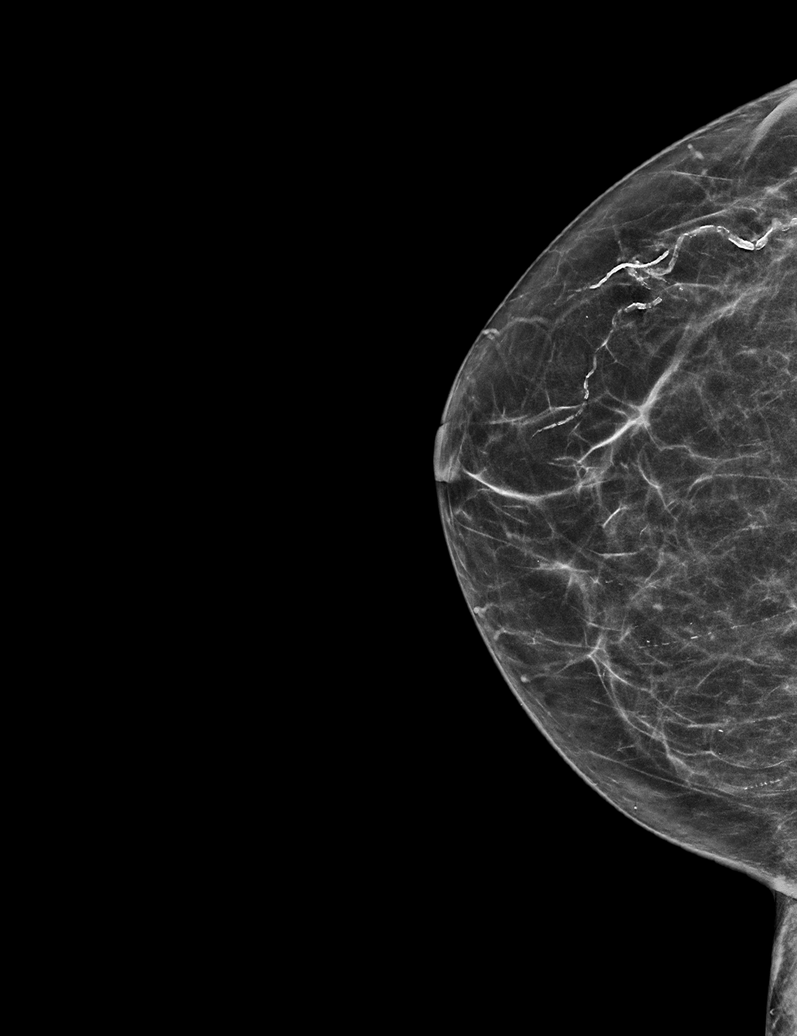

[R MLO synth-2D]
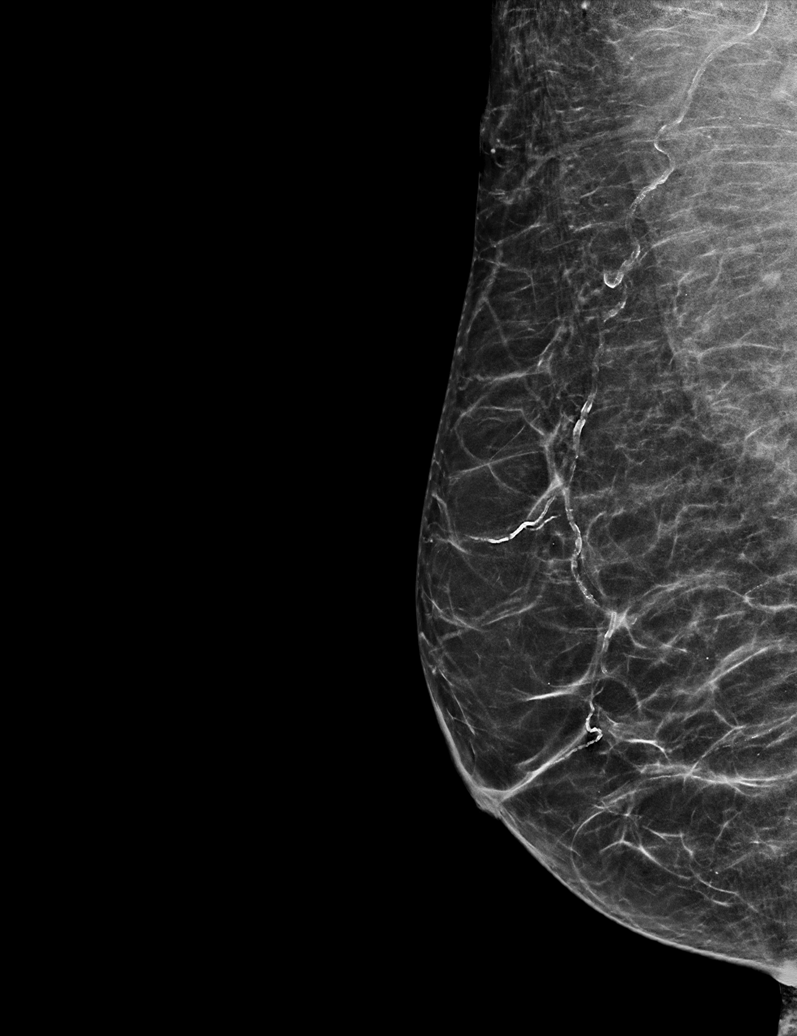

[L CC synth-2D]
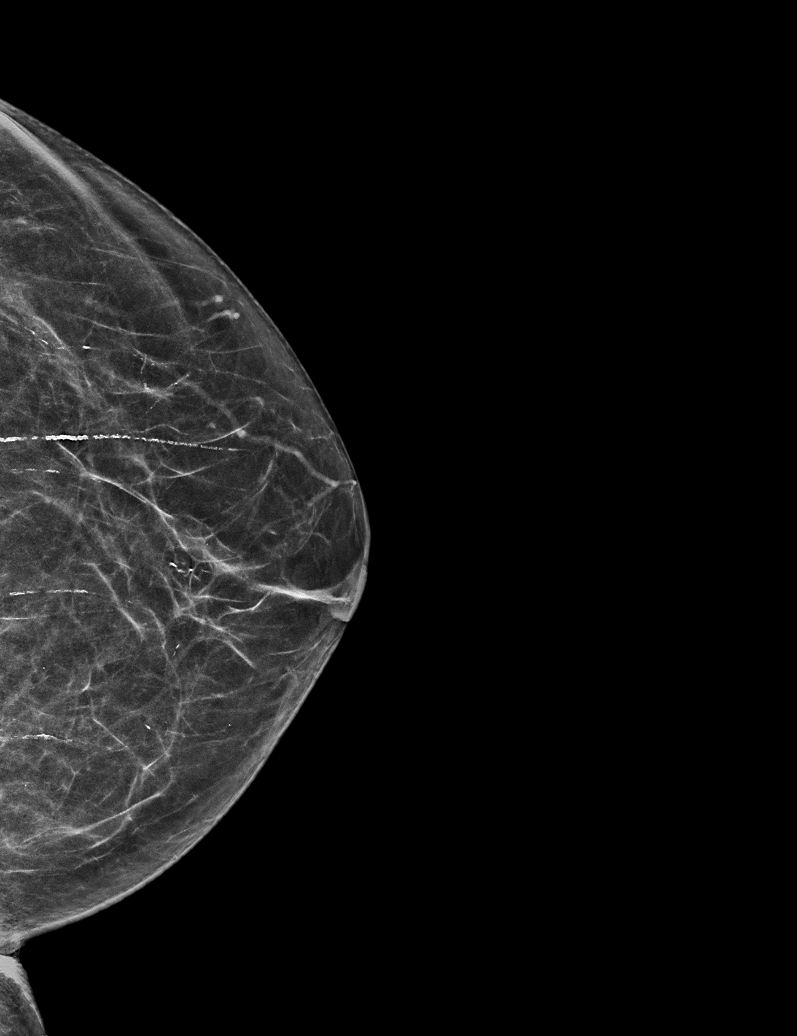

[R CC tomo · tomo slice 27/52.0]
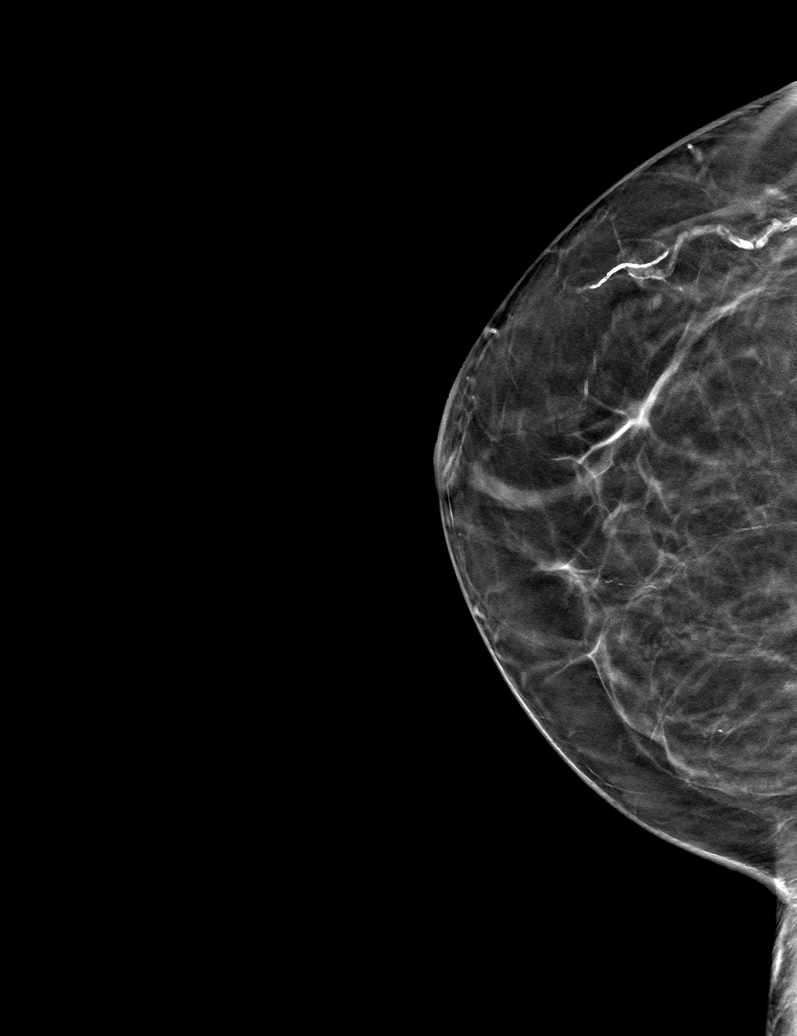

[6 of 30 positions shown; findings below may reference images not displayed]

ACR Breast Density Category b: There are scattered areas of
fibroglandular density.
FINDINGS: There are no findings suspicious for malignancy. Images were
processed with CAD.
IMPRESSION: No mammographic evidence of malignancy. A result letter of this
screening mammogram will be mailed directly to the patient.

RECOMMENDATION:
Screening mammogram in one year. (Code:CN-U-775)

BI-RADS CATEGORY  1: Negative.

## 2021-08-12 ENCOUNTER — Encounter: Payer: Self-pay | Admitting: Urology

## 2021-08-12 ENCOUNTER — Ambulatory Visit: Payer: Medicare Other | Admitting: Urology

## 2021-08-12 VITALS — BP 130/83 | HR 108 | Ht 61.0 in | Wt 105.0 lb

## 2021-08-12 DIAGNOSIS — R8281 Pyuria: Secondary | ICD-10-CM | POA: Diagnosis not present

## 2021-08-12 DIAGNOSIS — R3129 Other microscopic hematuria: Secondary | ICD-10-CM | POA: Diagnosis not present

## 2021-08-12 LAB — MICROSCOPIC EXAMINATION
Bacteria, UA: NONE SEEN
Epithelial Cells (non renal): >10 /hpf — AB (ref 0–10)

## 2021-08-12 LAB — URINALYSIS, COMPLETE
Bilirubin, UA: NEGATIVE
Glucose, UA: NEGATIVE
Ketones, UA: NEGATIVE
Leukocytes,UA: NEGATIVE
Nitrite, UA: NEGATIVE
Protein,UA: NEGATIVE
Specific Gravity, UA: 1.015 (ref 1.005–1.030)
Urobilinogen, Ur: 0.2 mg/dL (ref 0.2–1.0)
pH, UA: 6 (ref 5.0–7.5)

## 2021-08-12 NOTE — Progress Notes (Signed)
   08/12/2021 10:50 AM   Carles Collet 1950/08/22 [id number]  Referring provider: Marisue Ivan, MD [phone] [facility] [address] [facility] [place],  [place] [address]  Chief Complaint  Patient presents with   Hematuria    HPI: Berniece Abid is a 71 y.o. female referred for evaluation of hematuria.  She was accompanied by a [place] interpreter and her daughter who speaks [place] and provided the history.  Called PCP office early April 2023 complaining of dysuria and UTI symptoms. No flank, abdominal or pelvic pain Urinalysis was cloudy with 10-50 WBCs/10-50 RBCs on microscopy Started empirically on Septra DS and urine culture was ordered which had insignificant growth Symptoms improved however recurred on 07/25/2021 UA showed >50 WBC and 10-50 RBCs with many bacteria Started empirically on Macrobid; urine culture again had insignificant growth Presently asymptomatic History occasional UTI Creatinine 06/06/2021 0.9   PMH: Past Medical History:  Diagnosis Date   Hyperlipidemia     Surgical History: Past Surgical History:  Procedure Laterality Date   ABDOMINAL HYSTERECTOMY     BLADDER SURGERY     TUBAL LIGATION      Home Medications:  Allergies as of 08/12/2021       Reactions   Lovastatin    Other reaction(s): Headache Intolerance        Medication List        Accurate as of August 12, 2021 10:50 AM. If you have any questions, ask your nurse or doctor.          atorvastatin 10 MG tablet Commonly known as: LIPITOR Take 1 tablet (10 mg total) by mouth daily. Reported on 04/20/2015   estradiol 0.05 mg/24hr patch Commonly known as: CLIMARA - Dosed in mg/24 hr Place 1 patch (0.05 mg total) onto the skin once a week.        Allergies:  Allergies  Allergen Reactions   Lovastatin     Other reaction(s): Headache Intolerance    Family History: Family History  Problem Relation Age of Onset   Liver disease Sister    Liver disease Brother     Breast cancer Neg Hx     Social History:  reports that she has never smoked. She has never used smokeless tobacco. She reports current alcohol use. She reports that she does not use drugs.   Physical Exam: BP 130/83   Pulse (!) 108   Ht 5\' 1"  (1.549 m)   Wt 105 lb (47.6 kg)   LMP 04/04/2013 (Approximate)   BMI 19.84 kg/m   Constitutional:  Alert, No acute distress. HEENT: [place] AT Respiratory: Normal respiratory effort, no increased work of breathing. GI: Abdomen is soft, nontender, nondistended, no abdominal masses Skin: No rashes, bruises or suspicious lesions. Neurologic: Grossly intact, no focal deficits, moving all 4 extremities. Psychiatric: Normal mood and affect.  Laboratory Data:  Urinalysis Dipstick trace blood Microscopy negative   Assessment & Plan:   71 y.o. female with history significant pyuria and microhematuria with negative urine cultures x2 Dysuria and frequency/urgency when urines were ordered Presently asymptomatic and urinalysis today clear Recommend further evaluation with CT urogram and cystoscopy The procedures were discussed in detail.  All questions were answered and they desire to proceed with further evaluation   59, MD  [facility] [address], [address] [place], [place] [phone]

## 2021-08-28 ENCOUNTER — Ambulatory Visit: Admission: RE | Admit: 2021-08-28 | Payer: Medicare Other | Source: Ambulatory Visit

## 2021-09-09 ENCOUNTER — Ambulatory Visit
Admission: RE | Admit: 2021-09-09 | Discharge: 2021-09-09 | Disposition: A | Discharge status: Home / Self Care | Payer: Medicare Other | Source: Ambulatory Visit | Attending: Urology | Admitting: Urology

## 2021-09-09 DIAGNOSIS — R8281 Pyuria: Secondary | ICD-10-CM | POA: Diagnosis present

## 2021-09-09 DIAGNOSIS — R3129 Other microscopic hematuria: Secondary | ICD-10-CM | POA: Diagnosis not present

## 2021-09-09 MED ORDER — IOHEXOL 300 MG/ML  SOLN
100.0000 mL | Freq: Once | INTRAMUSCULAR | Status: AC | PRN
  Administered 2021-09-09: 100 mL via INTRAVENOUS

## 2021-09-10 LAB — POCT I-STAT CREATININE: Creatinine, Ser: 1 mg/dL (ref 0.44–1.00)

## 2021-09-11 ENCOUNTER — Ambulatory Visit: Payer: Medicare Other | Admitting: Urology

## 2021-09-11 ENCOUNTER — Encounter: Payer: Self-pay | Admitting: Urology

## 2021-09-11 VITALS — BP 155/105 | HR 88 | Ht 61.0 in | Wt 105.0 lb

## 2021-09-11 DIAGNOSIS — R3129 Other microscopic hematuria: Secondary | ICD-10-CM | POA: Diagnosis not present

## 2021-09-11 DIAGNOSIS — R8281 Pyuria: Secondary | ICD-10-CM | POA: Diagnosis not present

## 2021-09-11 NOTE — Progress Notes (Signed)
   09/11/21  CC:  Chief Complaint  Patient presents with   Cysto    HPI: Significant pyuria/microhematuria with negative urine culture.  CTU 09/09/2021 without upper tract abnormalities.  Urinalysis today clear.  Her daughter and a [place] interpreter were present.  The interpreter was present on speaker phone  Last menstrual period 04/04/2013. NED. A&Ox3.   No respiratory distress   Abd soft, NT, ND Atrophic external genitalia with patent urethral meatus  Cystoscopy Procedure Note  Patient identification was confirmed, informed consent was obtained, and patient was prepped using Betadine solution.  Lidocaine jelly was administered per urethral meatus.    Procedure: - Flexible cystoscope introduced, without any difficulty.   - Thorough search of the bladder revealed:    normal urethral meatus    normal urothelium    no stones    no ulcers     no tumors    no urethral polyps    no trabeculation  - Ureteral orifices were normal in position and appearance.  Post-Procedure: - Patient tolerated the procedure well  Assessment/ Plan: No upper tract abnormalities on CTU No lower tract abnormalities on cystoscopy Suspect her pyuria/microhematuria was secondary to infection Follow-up prn recurrent symptoms or gross hematuria    Riki Altes, MD

## 2021-09-12 LAB — URINALYSIS, COMPLETE
Bilirubin, UA: NEGATIVE
Glucose, UA: NEGATIVE
Ketones, UA: NEGATIVE
Nitrite, UA: NEGATIVE
Specific Gravity, UA: 1.015 (ref 1.005–1.030)
Urobilinogen, Ur: 0.2 mg/dL (ref 0.2–1.0)
pH, UA: 7.5 (ref 5.0–7.5)

## 2021-09-12 LAB — MICROSCOPIC EXAMINATION

## 2021-12-17 ENCOUNTER — Other Ambulatory Visit: Payer: Self-pay | Admitting: Family Medicine

## 2021-12-17 DIAGNOSIS — Z1231 Encounter for screening mammogram for malignant neoplasm of breast: Secondary | ICD-10-CM

## 2022-01-17 ENCOUNTER — Ambulatory Visit
Admission: RE | Admit: 2022-01-17 | Discharge: 2022-01-17 | Disposition: A | Discharge status: Home / Self Care | Payer: Medicare HMO | Source: Ambulatory Visit | Attending: Family Medicine | Admitting: Family Medicine

## 2022-01-17 DIAGNOSIS — Z1231 Encounter for screening mammogram for malignant neoplasm of breast: Secondary | ICD-10-CM | POA: Insufficient documentation

## 2022-12-12 ENCOUNTER — Other Ambulatory Visit: Payer: Self-pay | Admitting: Family Medicine

## 2022-12-12 DIAGNOSIS — Z1231 Encounter for screening mammogram for malignant neoplasm of breast: Secondary | ICD-10-CM

## 2023-01-16 DIAGNOSIS — I10 Essential (primary) hypertension: Secondary | ICD-10-CM | POA: Diagnosis not present

## 2023-01-16 DIAGNOSIS — I480 Paroxysmal atrial fibrillation: Secondary | ICD-10-CM | POA: Diagnosis not present

## 2023-01-16 DIAGNOSIS — Z136 Encounter for screening for cardiovascular disorders: Secondary | ICD-10-CM | POA: Diagnosis not present

## 2023-01-16 DIAGNOSIS — R7303 Prediabetes: Secondary | ICD-10-CM | POA: Diagnosis not present

## 2023-01-20 ENCOUNTER — Ambulatory Visit
Admission: RE | Admit: 2023-01-20 | Discharge: 2023-01-20 | Disposition: A | Discharge status: Home / Self Care | Payer: No Typology Code available for payment source | Source: Ambulatory Visit | Attending: Family Medicine | Admitting: Family Medicine

## 2023-01-20 DIAGNOSIS — Z1231 Encounter for screening mammogram for malignant neoplasm of breast: Secondary | ICD-10-CM | POA: Diagnosis not present

## 2023-01-23 DIAGNOSIS — E782 Mixed hyperlipidemia: Secondary | ICD-10-CM | POA: Diagnosis not present

## 2023-01-23 DIAGNOSIS — Z0000 Encounter for general adult medical examination without abnormal findings: Secondary | ICD-10-CM | POA: Diagnosis not present

## 2023-01-23 DIAGNOSIS — E1169 Type 2 diabetes mellitus with other specified complication: Secondary | ICD-10-CM | POA: Diagnosis not present

## 2023-01-23 DIAGNOSIS — I480 Paroxysmal atrial fibrillation: Secondary | ICD-10-CM | POA: Diagnosis not present

## 2023-01-23 DIAGNOSIS — I10 Essential (primary) hypertension: Secondary | ICD-10-CM | POA: Diagnosis not present

## 2023-01-23 DIAGNOSIS — G4709 Other insomnia: Secondary | ICD-10-CM | POA: Diagnosis not present

## 2023-01-23 DIAGNOSIS — E7800 Pure hypercholesterolemia, unspecified: Secondary | ICD-10-CM | POA: Diagnosis not present

## 2023-01-27 DIAGNOSIS — R300 Dysuria: Secondary | ICD-10-CM | POA: Diagnosis not present

## 2023-07-07 DIAGNOSIS — R300 Dysuria: Secondary | ICD-10-CM | POA: Diagnosis not present

## 2023-07-17 DIAGNOSIS — I10 Essential (primary) hypertension: Secondary | ICD-10-CM | POA: Diagnosis not present

## 2023-07-17 DIAGNOSIS — E782 Mixed hyperlipidemia: Secondary | ICD-10-CM | POA: Diagnosis not present

## 2023-07-17 DIAGNOSIS — E7800 Pure hypercholesterolemia, unspecified: Secondary | ICD-10-CM | POA: Diagnosis not present

## 2023-07-17 DIAGNOSIS — E1169 Type 2 diabetes mellitus with other specified complication: Secondary | ICD-10-CM | POA: Diagnosis not present

## 2023-07-24 DIAGNOSIS — E7800 Pure hypercholesterolemia, unspecified: Secondary | ICD-10-CM | POA: Diagnosis not present

## 2023-07-24 DIAGNOSIS — E1169 Type 2 diabetes mellitus with other specified complication: Secondary | ICD-10-CM | POA: Diagnosis not present

## 2023-07-24 DIAGNOSIS — E782 Mixed hyperlipidemia: Secondary | ICD-10-CM | POA: Diagnosis not present

## 2023-07-24 DIAGNOSIS — Z87440 Personal history of urinary (tract) infections: Secondary | ICD-10-CM | POA: Diagnosis not present

## 2023-07-24 DIAGNOSIS — I10 Essential (primary) hypertension: Secondary | ICD-10-CM | POA: Diagnosis not present

## 2023-07-24 DIAGNOSIS — I480 Paroxysmal atrial fibrillation: Secondary | ICD-10-CM | POA: Diagnosis not present

## 2023-08-05 DIAGNOSIS — R300 Dysuria: Secondary | ICD-10-CM | POA: Diagnosis not present

## 2023-09-14 DIAGNOSIS — H25811 Combined forms of age-related cataract, right eye: Secondary | ICD-10-CM | POA: Diagnosis not present

## 2023-09-24 DIAGNOSIS — I4891 Unspecified atrial fibrillation: Secondary | ICD-10-CM | POA: Diagnosis not present

## 2023-09-24 DIAGNOSIS — H25811 Combined forms of age-related cataract, right eye: Secondary | ICD-10-CM | POA: Diagnosis not present

## 2023-12-15 DIAGNOSIS — I480 Paroxysmal atrial fibrillation: Secondary | ICD-10-CM | POA: Diagnosis not present
